# Patient Record
Sex: Male | Born: 2005 | Race: White | Hispanic: No | Marital: Single | Smoking: Never smoker
Health system: Southern US, Community
[De-identification: ages and names within clinical notes are randomized; demographics above are authoritative.]

## PROBLEM LIST (undated history)

## (undated) DIAGNOSIS — R569 Unspecified convulsions: Secondary | ICD-10-CM

## (undated) HISTORY — PX: INGUINAL HERNIA REPAIR: SUR1180

## (undated) HISTORY — PX: SURGERY SCROTAL / TESTICULAR: SUR1316

---
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
## 2023-04-09 ENCOUNTER — Encounter (INDEPENDENT_AMBULATORY_CARE_PROVIDER_SITE_OTHER): Payer: Self-pay
